# Patient Record
Sex: Male | Born: 1980 | Race: White | Hispanic: No | Marital: Married | Smoking: Current every day smoker
Health system: Southern US, Community
[De-identification: ages and names within clinical notes are randomized; demographics above are authoritative.]

## PROBLEM LIST (undated history)

## (undated) DIAGNOSIS — S82409A Unspecified fracture of shaft of unspecified fibula, initial encounter for closed fracture: Secondary | ICD-10-CM

## (undated) DIAGNOSIS — S82209A Unspecified fracture of shaft of unspecified tibia, initial encounter for closed fracture: Secondary | ICD-10-CM

## (undated) DIAGNOSIS — I10 Essential (primary) hypertension: Secondary | ICD-10-CM

## (undated) DIAGNOSIS — T86822 Skin graft (allograft) (autograft) infection: Secondary | ICD-10-CM

## (undated) DIAGNOSIS — Z22322 Carrier or suspected carrier of Methicillin resistant Staphylococcus aureus: Secondary | ICD-10-CM

## (undated) HISTORY — PX: ARM WOUND REPAIR / CLOSURE: SUR1141

---

## 2011-01-23 ENCOUNTER — Emergency Department (HOSPITAL_BASED_OUTPATIENT_CLINIC_OR_DEPARTMENT_OTHER)
Admission: EM | Admit: 2011-01-23 | Discharge: 2011-01-23 | Disposition: A | Discharge status: Home / Self Care | Payer: Self-pay | Attending: Emergency Medicine | Admitting: Emergency Medicine

## 2011-01-23 DIAGNOSIS — K089 Disorder of teeth and supporting structures, unspecified: Secondary | ICD-10-CM | POA: Insufficient documentation

## 2012-08-06 ENCOUNTER — Encounter (HOSPITAL_BASED_OUTPATIENT_CLINIC_OR_DEPARTMENT_OTHER): Payer: Self-pay | Admitting: Emergency Medicine

## 2012-08-06 ENCOUNTER — Emergency Department (HOSPITAL_BASED_OUTPATIENT_CLINIC_OR_DEPARTMENT_OTHER)
Admission: EM | Admit: 2012-08-06 | Discharge: 2012-08-06 | Disposition: A | Discharge status: Home / Self Care | Payer: Self-pay | Attending: Emergency Medicine | Admitting: Emergency Medicine

## 2012-08-06 DIAGNOSIS — X58XXXA Exposure to other specified factors, initial encounter: Secondary | ICD-10-CM | POA: Insufficient documentation

## 2012-08-06 DIAGNOSIS — Y939 Activity, unspecified: Secondary | ICD-10-CM | POA: Insufficient documentation

## 2012-08-06 DIAGNOSIS — Y929 Unspecified place or not applicable: Secondary | ICD-10-CM | POA: Insufficient documentation

## 2012-08-06 DIAGNOSIS — IMO0002 Reserved for concepts with insufficient information to code with codable children: Secondary | ICD-10-CM | POA: Insufficient documentation

## 2012-08-06 DIAGNOSIS — F17200 Nicotine dependence, unspecified, uncomplicated: Secondary | ICD-10-CM | POA: Insufficient documentation

## 2012-08-06 DIAGNOSIS — S46119A Strain of muscle, fascia and tendon of long head of biceps, unspecified arm, initial encounter: Secondary | ICD-10-CM

## 2012-08-06 MED ORDER — OXYCODONE-ACETAMINOPHEN 5-325 MG PO TABS: 1.0000 | ORAL_TABLET | Freq: Four times a day (QID) | ORAL | Status: DC | PRN

## 2012-08-06 NOTE — ED Notes (Signed)
Pain in right arm x2 days.  Had muscle injury with surgical repair in June. Dr. Christell Constant in [place].  Has been doing well until 2 evenings ago at work when he felt pain "like a bee sting" at injury site.  Pain shooting up and down arm. Pain increased if he extends elbow.

## 2012-08-06 NOTE — ED Provider Notes (Addendum)
History     CSN: [id number]  Arrival date & time 08/06/12  7829   First MD Initiated Contact with Patient 08/06/12 0047      Chief Complaint  Patient presents with  . Arm Pain    (Consider location/radiation/quality/duration/timing/severity/associated sxs/prior treatment) HPI This is a 31 year old male who had his distal biceps transected by a piece of glass in June of this year. The biceps retracted proximally and required surgical repair. He had been doing well until 2 days ago when he felt a sudden onset of pain at the site of the original injury. He describes the pain as feeling like a bee sting. It has persisted and is now shooting up and down his arm. The pain is worse on extension of the elbow. He is still able to move the right elbow. There is no numbness or weakness distally. He does feel some pain in the right side of his neck when he moves his right arm. She's been taking ibuprofen 800 mg without adequate relief. He is out of his oxycodone.  History reviewed. No pertinent past medical history.  Past Surgical History  Procedure Date  . Arm wound repair / closure     No family history on file.  History  Substance Use Topics  . Smoking status: Current Every Day Smoker -- 0.5 packs/day  . Smokeless tobacco: Never Used  . Alcohol Use: Yes     Comment: occ      Review of Systems  All other systems reviewed and are negative.    Allergies  Ceclor and Keflex  Home Medications   Current Outpatient Rx  Name  Route  Sig  Dispense  Refill  . OMEGA-3 FATTY ACIDS 1000 MG PO CAPS   Oral   Take 2 g by mouth daily.         Marland Kitchen ONE-DAILY MULTI VITAMINS PO TABS   Oral   Take 1 tablet by mouth daily.           BP 145/91  Pulse 80  Temp 98.3 F (36.8 C) (Oral)  Resp 16  Ht 5\' 10"  (1.778 m)  Wt 190 lb (86.183 kg)  BMI 27.26 kg/m2  SpO2 99%  Physical Exam General: Well-developed, well-nourished male in no acute distress; appearance consistent with age of  record HENT: normocephalic, atraumatic Eyes: pupils equal round and reactive to light; extraocular muscles intact Neck: supple Heart: regular rate and rhythm Lungs: clear to auscultation bilaterally Abdomen: soft; nondistended Extremities: No deformity; full range of motion; pulses normal; well healed surgical incision over distal right biceps; tenderness over the distal right biceps tendon without evidence of rupture or infection Neurologic: Awake, alert and oriented; motor function intact in all extremities and symmetric; no facial droop Skin: Warm and dry Psychiatric: Normal mood and affect    ED Course  Procedures (including critical care time)     MDM  History and examination consistent with partial tear of the healed distal biceps. We'll immobilize the elbow pending followup with the patient's surgeon, Dr. Christell Constant in [facility]        Carlisle Beers [place], MD 08/06/12 0103  Hanley Seamen, MD 08/06/12 5621

## 2012-09-20 ENCOUNTER — Emergency Department (HOSPITAL_BASED_OUTPATIENT_CLINIC_OR_DEPARTMENT_OTHER)
Admission: EM | Admit: 2012-09-20 | Discharge: 2012-09-20 | Disposition: A | Discharge status: Home / Self Care | Payer: Self-pay | Attending: Emergency Medicine | Admitting: Emergency Medicine

## 2012-09-20 ENCOUNTER — Encounter (HOSPITAL_BASED_OUTPATIENT_CLINIC_OR_DEPARTMENT_OTHER): Payer: Self-pay | Admitting: *Deleted

## 2012-09-20 DIAGNOSIS — M25529 Pain in unspecified elbow: Secondary | ICD-10-CM | POA: Insufficient documentation

## 2012-09-20 DIAGNOSIS — F17200 Nicotine dependence, unspecified, uncomplicated: Secondary | ICD-10-CM | POA: Insufficient documentation

## 2012-09-20 MED ORDER — OXYCODONE-ACETAMINOPHEN 5-325 MG PO TABS: 1.0000 | ORAL_TABLET | Freq: Four times a day (QID) | ORAL | Status: AC | PRN

## 2012-09-20 NOTE — ED Notes (Signed)
Applied a Lrg Ice bag to Pt's Right arm

## 2012-09-20 NOTE — ED Notes (Signed)
States injured right arm in June- had muscle repair- pain in same since Thursday- denies new injury

## 2012-09-20 NOTE — ED Provider Notes (Signed)
History     CSN: [id number]  Arrival date & time 09/20/12  2056   None     Chief Complaint  Patient presents with  . Arm Pain    (Consider location/radiation/quality/duration/timing/severity/associated sxs/prior treatment) HPI  This is a 32 year old male who had his distal biceps transected by a piece of glass in June of this year. The biceps retracted proximally and required surgical repair. He had been doing well until today when he felt a sudden onset of pain at the site of the original injury. He describes the pain as feeling like a bee sting. It has persisted and is now shooting up and down his arm. The pain is worse on extension of the elbow. He is still able to move the right elbow. There is no numbness or weakness distally. He does feel some pain in the right side of his neck when he moves his right arm. She's been taking ibuprofen 800 mg without adequate relief. He is out of his oxycodone. He is supposed to follow-up with Dr. Christell Constant his surgeon but he wants $187 up front and he can not afford that right now. But he has insurance kicking in in 21 days. nad vss   History reviewed. No pertinent past medical history.  Past Surgical History  Procedure Date  . Arm wound repair / closure     No family history on file.  History  Substance Use Topics  . Smoking status: Current Every Day Smoker -- 0.5 packs/day  . Smokeless tobacco: Never Used  . Alcohol Use: 3.6 oz/week    6 Cans of beer per week     Comment: occ      Review of Systems  Review of Systems  Gen: no weight loss, fevers, chills, night sweats  Eyes: no discharge or drainage, no occular pain or visual changes  Nose: no epistaxis or rhinorrhea  Mouth: no dental pain, no sore throat  Neck: no neck pain  Lungs:No wheezing, coughing or hemoptysis CV: no chest pain, palpitations, dependent edema or orthopnea  Abd: no abdominal pain, nausea, vomiting  GU: no dysuria or gross hematuria  MSK:  Right elbow  pain Neuro: no headache, no focal neurologic deficits  Skin: no abnormalities Psyche: negative.   Allergies  Ceclor and Keflex  Home Medications   Current Outpatient Rx  Name  Route  Sig  Dispense  Refill  . AMOXICILLIN 500 MG PO TABS   Oral   Take 500 mg by mouth 3 (three) times daily.         . OMEGA-3 FATTY ACIDS 1000 MG PO CAPS   Oral   Take 2 g by mouth daily.         Marland Kitchen ONE-DAILY MULTI VITAMINS PO TABS   Oral   Take 1 tablet by mouth daily.         . OXYCODONE-ACETAMINOPHEN 5-325 MG PO TABS   Oral   Take 1-2 tablets by mouth every 6 (six) hours as needed for pain.   30 tablet   0     BP 155/83  Pulse 98  Temp 98.3 F (36.8 C) (Oral)  Resp 18  SpO2 99%  Physical Exam  Nursing note and vitals reviewed. Constitutional: He appears well-developed and well-nourished. No distress.  HENT:  Head: Normocephalic and atraumatic.  Eyes: Pupils are equal, round, and reactive to light.  Neck: Normal range of motion. Neck supple.  Cardiovascular: Normal rate and regular rhythm.   Pulmonary/Chest: Effort normal.  Abdominal: Soft.  Musculoskeletal:       Right elbow: He exhibits decreased range of motion. He exhibits no swelling, no effusion, no deformity and no laceration.  Neurological: He is alert.  Skin: Skin is warm and dry.    ED Course  Procedures (including critical care time)  Labs Reviewed - No data to display No results found.   1. Elbow pain       MDM  Pt will see his surgeon after his insurance kicks in.  He has no new symptoms at this time and is out of his pain medications. Dr. Christell Constant thinks that they may need to open him back up and work on his arm some.  Pain med refill given  Pt has been advised of the symptoms that warrant their return to the ED. Patient has voiced understanding and has agreed to follow-up with the PCP or specialist.         Dorthula Matas, PA 09/20/12 2300

## 2012-09-20 NOTE — ED Notes (Signed)
Patient states that his right arm has been hurting for the past few days. States that he was prescribed percocet but that he has run out and that he wont have medical insurance until next month.

## 2012-09-21 NOTE — ED Provider Notes (Signed)
Medical screening examination/treatment/procedure(s) were performed by non-physician practitioner and as supervising physician I was immediately available for consultation/collaboration.   Shelda Jakes, MD 09/21/12 1225

## 2012-10-16 ENCOUNTER — Encounter (HOSPITAL_BASED_OUTPATIENT_CLINIC_OR_DEPARTMENT_OTHER): Payer: Self-pay | Admitting: *Deleted

## 2012-10-16 ENCOUNTER — Emergency Department (HOSPITAL_BASED_OUTPATIENT_CLINIC_OR_DEPARTMENT_OTHER)
Admission: EM | Admit: 2012-10-16 | Discharge: 2012-10-16 | Disposition: A | Discharge status: Home / Self Care | Payer: Self-pay | Attending: Emergency Medicine | Admitting: Emergency Medicine

## 2012-10-16 DIAGNOSIS — M79603 Pain in arm, unspecified: Secondary | ICD-10-CM

## 2012-10-16 DIAGNOSIS — Z79899 Other long term (current) drug therapy: Secondary | ICD-10-CM | POA: Insufficient documentation

## 2012-10-16 DIAGNOSIS — M79609 Pain in unspecified limb: Secondary | ICD-10-CM | POA: Insufficient documentation

## 2012-10-16 DIAGNOSIS — F17200 Nicotine dependence, unspecified, uncomplicated: Secondary | ICD-10-CM | POA: Insufficient documentation

## 2012-10-16 DIAGNOSIS — G8911 Acute pain due to trauma: Secondary | ICD-10-CM | POA: Insufficient documentation

## 2012-10-16 MED ORDER — OXYCODONE-ACETAMINOPHEN 5-325 MG PO TABS: 1.0000 | ORAL_TABLET | ORAL | Status: AC | PRN

## 2012-10-16 NOTE — ED Notes (Signed)
Right arm pain. States he had surgery on his right arm in June and has been having pain at the surgical site today.

## 2012-10-16 NOTE — ED Provider Notes (Signed)
History     CSN: [id number]  Arrival date & time 10/16/12  2056   First MD Initiated Contact with Patient 10/16/12 2111      Chief Complaint  Patient presents with  . Arm Pain    (Consider location/radiation/quality/duration/timing/severity/associated sxs/prior treatment) HPI Comments: This is a 32 year old male who had his distal biceps transected by a piece of glass in June of this year. The biceps retracted proximally and required surgical repair. He had been doing well until the last 2 months when he feels intermittent  sudden onset of pain at the site of the original injury. He describes the pain as feeling like a bee sting. It has persisted and is now shooting up and down his arm. The pain is worse on extension of the elbow. He is still able to move the right elbow. There is no numbness or weakness distally. He does feel some pain in the right side of his neck when he moves his right arm. He is out of his oxycodone. He is supposed to follow-up with Dr. Christell Constant his surgeon but he wants $175 up front and he can not afford that right now. But he has insurance kicking in in 21 days.    Patient is a 32 y.o. male presenting with arm pain.  Arm Pain    History reviewed. No pertinent past medical history.  Past Surgical History  Procedure Date  . Arm wound repair / closure     No family history on file.  History  Substance Use Topics  . Smoking status: Current Every Day Smoker -- 0.5 packs/day    Types: Cigarettes  . Smokeless tobacco: Never Used  . Alcohol Use: 3.6 oz/week    6 Cans of beer per week     Comment: occ      Review of Systems  Constitutional: Negative.   Respiratory: Negative.   Cardiovascular: Negative.     Allergies  Ceclor and Keflex  Home Medications   Current Outpatient Rx  Name  Route  Sig  Dispense  Refill  . AMOXICILLIN 500 MG PO TABS   Oral   Take 500 mg by mouth 3 (three) times daily.         . OMEGA-3 FATTY ACIDS 1000 MG PO CAPS  Oral   Take 2 g by mouth daily.         Marland Kitchen ONE-DAILY MULTI VITAMINS PO TABS   Oral   Take 1 tablet by mouth daily.         . OXYCODONE-ACETAMINOPHEN 5-325 MG PO TABS   Oral   Take 1-2 tablets by mouth every 6 (six) hours as needed for pain.   30 tablet   0     BP 152/79  Pulse 104  Temp 97.8 F (36.6 C) (Oral)  Resp 20  SpO2 100%  Physical Exam  Nursing note and vitals reviewed. Constitutional: He is oriented to person, place, and time. He appears well-developed and well-nourished.  HENT:  Head: Normocephalic and atraumatic.  Cardiovascular: Normal rate and regular rhythm.   Pulmonary/Chest: Effort normal and breath sounds normal.  Musculoskeletal:       Pt has pain on palpation over the healed surgical site to the right ac:pt has full WUJ:WJXB equal  Neurological: He is alert and oriented to person, place, and time.  Skin: Skin is warm and dry.  Psychiatric: He has a normal mood and affect.    ED Course  Procedures (including critical care time)  Labs Reviewed -  No data to display No results found.   1. Arm pain       MDM  Will treat with oxycodone:discussed with pt that this is a chronic problem and that he would likely not continue to get narcotics thru the ed       Teressa Lower, NP 10/16/12 2137

## 2012-10-17 NOTE — ED Provider Notes (Signed)
Medical screening examination/treatment/procedure(s) were performed by non-physician practitioner and as supervising physician I was immediately available for consultation/collaboration.  Kaidon Kinker, MD 10/17/12 0042 

## 2013-03-14 ENCOUNTER — Emergency Department (HOSPITAL_BASED_OUTPATIENT_CLINIC_OR_DEPARTMENT_OTHER)
Admission: EM | Admit: 2013-03-14 | Discharge: 2013-03-14 | Disposition: A | Discharge status: Home / Self Care | Payer: Self-pay | Attending: Emergency Medicine | Admitting: Emergency Medicine

## 2013-03-14 ENCOUNTER — Encounter (HOSPITAL_BASED_OUTPATIENT_CLINIC_OR_DEPARTMENT_OTHER): Payer: Self-pay | Admitting: *Deleted

## 2013-03-14 DIAGNOSIS — G8929 Other chronic pain: Secondary | ICD-10-CM | POA: Insufficient documentation

## 2013-03-14 DIAGNOSIS — Z792 Long term (current) use of antibiotics: Secondary | ICD-10-CM | POA: Insufficient documentation

## 2013-03-14 DIAGNOSIS — M79603 Pain in arm, unspecified: Secondary | ICD-10-CM

## 2013-03-14 DIAGNOSIS — M79609 Pain in unspecified limb: Secondary | ICD-10-CM | POA: Insufficient documentation

## 2013-03-14 DIAGNOSIS — Z87828 Personal history of other (healed) physical injury and trauma: Secondary | ICD-10-CM | POA: Insufficient documentation

## 2013-03-14 DIAGNOSIS — F17200 Nicotine dependence, unspecified, uncomplicated: Secondary | ICD-10-CM | POA: Insufficient documentation

## 2013-03-14 HISTORY — DX: Essential (primary) hypertension: I10

## 2013-03-14 MED ORDER — HYDROCODONE-ACETAMINOPHEN 5-325 MG PO TABS: 1.0000 | ORAL_TABLET | Freq: Four times a day (QID) | ORAL | Status: AC | PRN

## 2013-03-14 MED ORDER — TRAMADOL HCL 50 MG PO TABS
50.0000 mg | ORAL_TABLET | Freq: Once | ORAL | Status: AC
  Administered 2013-03-14: 50 mg via ORAL
  Filled 2013-03-14: qty 1

## 2013-03-14 NOTE — ED Notes (Signed)
Pt ambulating independently w/ steady gait on d/c in no acute distress, A&Ox4. D/c instructions reviewed w/ pt and family - pt and family deny any further questions or concerns at present. Rx given x1, Pt instructed to not use alcohol, drive, or operate heavy machinery while take the prescription pain medications as they could make him drowsy - pt verbalized understanding.    
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
## 2013-03-14 NOTE — ED Provider Notes (Signed)
History    CSN: [id number] Arrival date & time 03/14/13  0202  First MD Initiated Contact with Patient 03/14/13 0210     Chief Complaint  Patient presents with  . Arm Pain   (Consider location/radiation/quality/duration/timing/severity/associated sxs/prior Treatment) Patient is a 32 y.o. male presenting with arm injury. The history is provided by the patient.  Arm Injury Upper extremity pain location: biceps tendon. Time since incident:  12 months Injury: yes   Pain details:    Quality:  Aching   Radiates to:  Does not radiate   Severity:  Severe   Onset quality:  Gradual   Duration:  12 months   Timing:  Constant   Progression:  Unchanged Chronicity:  Chronic Dislocation: no   Foreign body present:  No foreign bodies Relieved by:  Nothing Worsened by:  Nothing tried Ineffective treatments:  None tried Associated symptoms: no neck pain, no numbness, no stiffness, no swelling and no tingling    No past medical history on file. Past Surgical History  Procedure Laterality Date  . Arm wound repair / closure     No family history on file. History  Substance Use Topics  . Smoking status: Current Every Day Smoker -- 0.50 packs/day    Types: Cigarettes  . Smokeless tobacco: Never Used  . Alcohol Use: 3.6 oz/week    6 Cans of beer per week     Comment: occ    Review of Systems  HENT: Negative for neck pain.   Musculoskeletal: Negative for stiffness.  All other systems reviewed and are negative.    Allergies  Ceclor and Keflex  Home Medications   Current Outpatient Rx  Name  Route  Sig  Dispense  Refill  . amoxicillin (AMOXIL) 500 MG tablet   Oral   Take 500 mg by mouth 3 (three) times daily.         . fish oil-omega-3 fatty acids 1000 MG capsule   Oral   Take 2 g by mouth daily.         . Multiple Vitamin (MULTIVITAMIN) tablet   Oral   Take 1 tablet by mouth daily.         Marland Kitchen oxyCODONE-acetaminophen (PERCOCET/ROXICET) 5-325 MG per tablet  Oral   Take 1-2 tablets by mouth every 6 (six) hours as needed for pain.   30 tablet   0   . oxyCODONE-acetaminophen (PERCOCET/ROXICET) 5-325 MG per tablet   Oral   Take 1 tablet by mouth every 4 (four) hours as needed for pain.   6 tablet   0    There were no vitals taken for this visit. Physical Exam  Constitutional: He is oriented to person, place, and time. He appears well-developed and well-nourished. No distress.  HENT:  Head: Normocephalic and atraumatic.  Eyes: Conjunctivae and EOM are normal. Pupils are equal, round, and reactive to light.  Neck: Normal range of motion. Neck supple.  Cardiovascular: Normal rate, regular rhythm and intact distal pulses.   Pulmonary/Chest: Effort normal and breath sounds normal. He has no wheezes. He has no rales.  Abdominal: Soft. Bowel sounds are normal. There is no tenderness. There is no rebound and no guarding.  Musculoskeletal: Normal range of motion. He exhibits no edema.  Intact biceps tendon  Neurological: He is alert and oriented to person, place, and time. He has normal reflexes.  Skin: Skin is warm and dry.  Psychiatric: He has a normal mood and affect.    ED Course  Procedures (including  critical care time) Labs Reviewed - No data to display No results found. No diagnosis found.  MDM  Chronic pain.  Follow up with your regular provider.    Jasmine Awe, MD 03/14/13 [phone]

## 2013-03-23 ENCOUNTER — Encounter (HOSPITAL_BASED_OUTPATIENT_CLINIC_OR_DEPARTMENT_OTHER): Payer: Self-pay | Admitting: Emergency Medicine

## 2013-03-23 ENCOUNTER — Emergency Department (HOSPITAL_BASED_OUTPATIENT_CLINIC_OR_DEPARTMENT_OTHER)
Admission: EM | Admit: 2013-03-23 | Discharge: 2013-03-23 | Disposition: A | Discharge status: Home / Self Care | Payer: Self-pay | Attending: Emergency Medicine | Admitting: Emergency Medicine

## 2013-03-23 DIAGNOSIS — Z87828 Personal history of other (healed) physical injury and trauma: Secondary | ICD-10-CM | POA: Insufficient documentation

## 2013-03-23 DIAGNOSIS — M79609 Pain in unspecified limb: Secondary | ICD-10-CM | POA: Insufficient documentation

## 2013-03-23 DIAGNOSIS — G8929 Other chronic pain: Secondary | ICD-10-CM | POA: Insufficient documentation

## 2013-03-23 DIAGNOSIS — I10 Essential (primary) hypertension: Secondary | ICD-10-CM | POA: Insufficient documentation

## 2013-03-23 DIAGNOSIS — Z79899 Other long term (current) drug therapy: Secondary | ICD-10-CM | POA: Insufficient documentation

## 2013-03-23 DIAGNOSIS — Z792 Long term (current) use of antibiotics: Secondary | ICD-10-CM | POA: Insufficient documentation

## 2013-03-23 DIAGNOSIS — F17200 Nicotine dependence, unspecified, uncomplicated: Secondary | ICD-10-CM | POA: Insufficient documentation

## 2013-03-23 MED ORDER — OXYCODONE-ACETAMINOPHEN 5-325 MG PO TABS: 2.0000 | ORAL_TABLET | Freq: Four times a day (QID) | ORAL | Status: AC | PRN

## 2013-03-23 MED ORDER — KETOROLAC TROMETHAMINE 60 MG/2ML IM SOLN
60.0000 mg | Freq: Once | INTRAMUSCULAR | Status: AC
  Administered 2013-03-23: 60 mg via INTRAMUSCULAR
  Filled 2013-03-23: qty 2

## 2013-03-23 NOTE — ED Provider Notes (Signed)
History    CSN: [id number] Arrival date & time 03/23/13  0350  First MD Initiated Contact with Patient 03/23/13 0410     Chief Complaint  Patient presents with  . Arm Injury   (Consider location/radiation/quality/duration/timing/severity/associated sxs/prior Treatment) Patient is a 32 y.o. male presenting with arm injury. The history is provided by the patient.  Arm Injury Location:  Elbow Time since incident:  2 weeks Injury: yes   Mechanism of injury comment:  Initial injury was deep laceration from blade at work requiring surgical repair due to laceration of the bicep tendon.  last week he moved somehting while at work and felt severe pain in the arm again and not imrpoved Elbow location:  R elbow Pain details:    Quality:  Aching, sharp, throbbing and tearing   Radiates to:  R shoulder and R wrist   Severity:  Severe   Onset quality:  Sudden   Duration:  2 weeks   Timing:  Constant   Progression:  Unchanged Chronicity:  Recurrent Handedness:  Right-handed Prior injury to area:  Yes Relieved by:  Nothing Worsened by:  Movement Ineffective treatments:  Immobilization, muscle relaxant, narcotics and NSAIDs Associated symptoms: decreased range of motion, muscle weakness and tingling   Associated symptoms: no swelling    Past Medical History  Diagnosis Date  . Hypertension    Past Surgical History  Procedure Laterality Date  . Arm wound repair / closure     No family history on file. History  Substance Use Topics  . Smoking status: Current Every Day Smoker -- 0.50 packs/day    Types: Cigarettes  . Smokeless tobacco: Never Used  . Alcohol Use: 3.6 oz/week    6 Cans of beer per week     Comment: occ    Review of Systems  All other systems reviewed and are negative.    Allergies  Ceclor and Keflex  Home Medications   Current Outpatient Rx  Name  Route  Sig  Dispense  Refill  . amoxicillin (AMOXIL) 500 MG tablet   Oral   Take 500 mg by mouth 3 (three)  times daily.         . fish oil-omega-3 fatty acids 1000 MG capsule   Oral   Take 2 g by mouth daily.         Marland Kitchen HYDROcodone-acetaminophen (NORCO) 5-325 MG per tablet   Oral   Take 1 tablet by mouth every 6 (six) hours as needed for pain.   6 tablet   0   . Multiple Vitamin (MULTIVITAMIN) tablet   Oral   Take 1 tablet by mouth daily.         Marland Kitchen oxyCODONE-acetaminophen (PERCOCET/ROXICET) 5-325 MG per tablet   Oral   Take 1-2 tablets by mouth every 6 (six) hours as needed for pain.   30 tablet   0   . oxyCODONE-acetaminophen (PERCOCET/ROXICET) 5-325 MG per tablet   Oral   Take 1 tablet by mouth every 4 (four) hours as needed for pain.   6 tablet   0   . oxyCODONE-acetaminophen (PERCOCET/ROXICET) 5-325 MG per tablet   Oral   Take 2 tablets by mouth every 6 (six) hours as needed for pain.   15 tablet   0    BP 136/80  Pulse 98  Temp(Src) 98 F (36.7 C) (Oral)  Resp 18  SpO2 98% Physical Exam  Nursing note and vitals reviewed. Constitutional: He is oriented to person, place, and time. He appears  well-developed and well-nourished. He appears distressed.  HENT:  Head: Normocephalic and atraumatic.  Eyes: EOM are normal. Pupils are equal, round, and reactive to light.  Cardiovascular: Normal rate.   Pulmonary/Chest: Effort normal.  Musculoskeletal:       Right elbow: He exhibits decreased range of motion. Tenderness found.       Arms: Neurological: He is alert and oriented to person, place, and time.  Weakness to the right bicep tendon with difficulty flexing against gravity  Skin: Skin is warm and dry.  Psychiatric: He has a normal mood and affect. His behavior is normal.    ED Course  Procedures (including critical care time) Labs Reviewed - No data to display No results found. 1. Chronic arm pain     MDM   Patient with a laceration of his arm approximately 8 months ago while at work lacerating his bicep tendon. He states things were going well until  approximately one week ago when he moved something at work and felt a pop in his arm. Since that time he's had severe pain but because he does the orthopedist $900 he will not see him. He states his pain the next week and he will see him and felt that he may need ongoing repair the arm. He has muscle weakness to the bicep tendon but 2+ radial pulse normal sensation and capillary refill. Patient seen here 10 days ago for same and states he is out of medication. He's been taking Flexeril as well but not helping. Patient given IM Toradol and refill prescription.  Gwyneth Sprout, MD 03/23/13 [phone]

## 2013-03-23 NOTE — ED Notes (Signed)
Pt c/o right arm pain which is chronic. Pt has been seen here on several occassions for same.

## 2013-03-23 NOTE — ED Notes (Signed)
Pt states he is out of percocet but has a few vicodin left, but vicodin doesn't help his pain.

## 2013-06-24 ENCOUNTER — Encounter (HOSPITAL_BASED_OUTPATIENT_CLINIC_OR_DEPARTMENT_OTHER): Payer: Self-pay | Admitting: Emergency Medicine

## 2013-06-24 ENCOUNTER — Emergency Department (HOSPITAL_BASED_OUTPATIENT_CLINIC_OR_DEPARTMENT_OTHER): Payer: Self-pay

## 2013-06-24 ENCOUNTER — Emergency Department (HOSPITAL_BASED_OUTPATIENT_CLINIC_OR_DEPARTMENT_OTHER)
Admission: EM | Admit: 2013-06-24 | Discharge: 2013-06-24 | Discharge status: Against Medical Advice | Payer: Self-pay | Attending: Emergency Medicine | Admitting: Emergency Medicine

## 2013-06-24 DIAGNOSIS — T86822 Skin graft (allograft) (autograft) infection: Secondary | ICD-10-CM

## 2013-06-24 DIAGNOSIS — M79605 Pain in left leg: Secondary | ICD-10-CM

## 2013-06-24 DIAGNOSIS — Z79899 Other long term (current) drug therapy: Secondary | ICD-10-CM | POA: Insufficient documentation

## 2013-06-24 DIAGNOSIS — T8579XA Infection and inflammatory reaction due to other internal prosthetic devices, implants and grafts, initial encounter: Secondary | ICD-10-CM | POA: Insufficient documentation

## 2013-06-24 DIAGNOSIS — F17200 Nicotine dependence, unspecified, uncomplicated: Secondary | ICD-10-CM | POA: Insufficient documentation

## 2013-06-24 DIAGNOSIS — Z792 Long term (current) use of antibiotics: Secondary | ICD-10-CM | POA: Insufficient documentation

## 2013-06-24 DIAGNOSIS — Y838 Other surgical procedures as the cause of abnormal reaction of the patient, or of later complication, without mention of misadventure at the time of the procedure: Secondary | ICD-10-CM | POA: Insufficient documentation

## 2013-06-24 DIAGNOSIS — I10 Essential (primary) hypertension: Secondary | ICD-10-CM | POA: Insufficient documentation

## 2013-06-24 HISTORY — DX: Skin graft (allograft) (autograft) infection: T86.822

## 2013-06-24 LAB — CBC WITH DIFFERENTIAL/PLATELET
Eosinophils Absolute: 0.2 10*3/uL (ref 0.0–0.7)
Eosinophils Relative: 2 % (ref 0–5)
HCT: 38.1 % — ABNORMAL LOW (ref 39.0–52.0)
Lymphocytes Relative: 25 % (ref 12–46)
Lymphs Abs: 2.1 10*3/uL (ref 0.7–4.0)
MCH: 28.2 pg (ref 26.0–34.0)
MCV: 86 fL (ref 78.0–100.0)
Monocytes Absolute: 0.6 10*3/uL (ref 0.1–1.0)
Platelets: 240 10*3/uL (ref 150–400)
RBC: 4.43 MIL/uL (ref 4.22–5.81)
RDW: 14.2 % (ref 11.5–15.5)
WBC: 8.2 10*3/uL (ref 4.0–10.5)

## 2013-06-24 LAB — BASIC METABOLIC PANEL
BUN: 11 mg/dL (ref 6–23)
CO2: 26 mEq/L (ref 19–32)
Calcium: 9.7 mg/dL (ref 8.4–10.5)
Creatinine, Ser: 0.9 mg/dL (ref 0.50–1.35)
GFR calc non Af Amer: >90 mL/min (ref >90)
Glucose, Bld: 114 mg/dL — ABNORMAL HIGH (ref 70–99)
Sodium: 140 mEq/L (ref 135–145)

## 2013-06-24 MED ORDER — KETOROLAC TROMETHAMINE 60 MG/2ML IM SOLN
60.0000 mg | Freq: Once | INTRAMUSCULAR | Status: AC
  Administered 2013-06-24: 60 mg via INTRAMUSCULAR
  Filled 2013-06-24: qty 2
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
## 2013-08-25 ENCOUNTER — Emergency Department (HOSPITAL_BASED_OUTPATIENT_CLINIC_OR_DEPARTMENT_OTHER)
Admission: EM | Admit: 2013-08-25 | Discharge: 2013-08-25 | Disposition: A | Discharge status: Home / Self Care | Payer: Self-pay | Attending: Emergency Medicine | Admitting: Emergency Medicine

## 2013-08-25 ENCOUNTER — Emergency Department (HOSPITAL_BASED_OUTPATIENT_CLINIC_OR_DEPARTMENT_OTHER): Payer: Self-pay

## 2013-08-25 ENCOUNTER — Encounter (HOSPITAL_BASED_OUTPATIENT_CLINIC_OR_DEPARTMENT_OTHER): Payer: Self-pay | Admitting: Emergency Medicine

## 2013-08-25 DIAGNOSIS — F17200 Nicotine dependence, unspecified, uncomplicated: Secondary | ICD-10-CM | POA: Insufficient documentation

## 2013-08-25 DIAGNOSIS — Z8781 Personal history of (healed) traumatic fracture: Secondary | ICD-10-CM | POA: Insufficient documentation

## 2013-08-25 DIAGNOSIS — Z79899 Other long term (current) drug therapy: Secondary | ICD-10-CM | POA: Insufficient documentation

## 2013-08-25 DIAGNOSIS — M2550 Pain in unspecified joint: Secondary | ICD-10-CM | POA: Insufficient documentation

## 2013-08-25 DIAGNOSIS — Z8619 Personal history of other infectious and parasitic diseases: Secondary | ICD-10-CM | POA: Insufficient documentation

## 2013-08-25 DIAGNOSIS — M79605 Pain in left leg: Secondary | ICD-10-CM

## 2013-08-25 DIAGNOSIS — M2540 Effusion, unspecified joint: Secondary | ICD-10-CM | POA: Insufficient documentation

## 2013-08-25 DIAGNOSIS — I10 Essential (primary) hypertension: Secondary | ICD-10-CM | POA: Insufficient documentation

## 2013-08-25 DIAGNOSIS — Z9889 Other specified postprocedural states: Secondary | ICD-10-CM | POA: Insufficient documentation

## 2013-08-25 DIAGNOSIS — Z792 Long term (current) use of antibiotics: Secondary | ICD-10-CM | POA: Insufficient documentation

## 2013-08-25 DIAGNOSIS — L02419 Cutaneous abscess of limb, unspecified: Secondary | ICD-10-CM | POA: Insufficient documentation

## 2013-08-25 DIAGNOSIS — L0390 Cellulitis, unspecified: Secondary | ICD-10-CM

## 2013-08-25 MED ORDER — KETOROLAC TROMETHAMINE 60 MG/2ML IM SOLN
60.0000 mg | Freq: Once | INTRAMUSCULAR | Status: AC
  Administered 2013-08-25: 60 mg via INTRAMUSCULAR
  Filled 2013-08-25: qty 2

## 2013-08-25 MED ORDER — OXYCODONE-ACETAMINOPHEN 5-325 MG PO TABS: 2.0000 | ORAL_TABLET | ORAL | Status: AC | PRN

## 2013-08-25 MED ORDER — CLINDAMYCIN HCL 300 MG PO CAPS: 300.0000 mg | ORAL_CAPSULE | Freq: Four times a day (QID) | ORAL | Status: AC

## 2013-08-25 MED ORDER — CLINDAMYCIN HCL 150 MG PO CAPS
ORAL_CAPSULE | ORAL | Status: AC
  Administered 2013-08-25: 300 mg via ORAL
  Filled 2013-08-25: qty 2

## 2013-08-25 MED ORDER — CLINDAMYCIN HCL 150 MG PO CAPS
300.0000 mg | ORAL_CAPSULE | Freq: Once | ORAL | Status: AC
  Administered 2013-08-25: 300 mg via ORAL

## 2013-08-25 NOTE — ED Notes (Signed)
Patient transported to X-ray 
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
## 2013-09-01 ENCOUNTER — Emergency Department (HOSPITAL_BASED_OUTPATIENT_CLINIC_OR_DEPARTMENT_OTHER)
Admission: EM | Admit: 2013-09-01 | Discharge: 2013-09-01 | Disposition: A | Discharge status: Home / Self Care | Payer: No Typology Code available for payment source | Attending: Emergency Medicine | Admitting: Emergency Medicine

## 2013-09-01 ENCOUNTER — Encounter (HOSPITAL_BASED_OUTPATIENT_CLINIC_OR_DEPARTMENT_OTHER): Payer: Self-pay | Admitting: Emergency Medicine

## 2013-09-01 DIAGNOSIS — G8929 Other chronic pain: Secondary | ICD-10-CM | POA: Insufficient documentation

## 2013-09-01 DIAGNOSIS — Z792 Long term (current) use of antibiotics: Secondary | ICD-10-CM | POA: Insufficient documentation

## 2013-09-01 DIAGNOSIS — Z79899 Other long term (current) drug therapy: Secondary | ICD-10-CM | POA: Insufficient documentation

## 2013-09-01 DIAGNOSIS — F17200 Nicotine dependence, unspecified, uncomplicated: Secondary | ICD-10-CM | POA: Insufficient documentation

## 2013-09-01 DIAGNOSIS — Z8781 Personal history of (healed) traumatic fracture: Secondary | ICD-10-CM | POA: Insufficient documentation

## 2013-09-01 DIAGNOSIS — Z8614 Personal history of Methicillin resistant Staphylococcus aureus infection: Secondary | ICD-10-CM | POA: Insufficient documentation

## 2013-09-01 DIAGNOSIS — Z87828 Personal history of other (healed) physical injury and trauma: Secondary | ICD-10-CM | POA: Insufficient documentation

## 2013-09-01 DIAGNOSIS — M79609 Pain in unspecified limb: Secondary | ICD-10-CM | POA: Insufficient documentation

## 2013-09-01 DIAGNOSIS — I10 Essential (primary) hypertension: Secondary | ICD-10-CM | POA: Insufficient documentation

## 2013-09-01 HISTORY — DX: Unspecified fracture of shaft of unspecified fibula, initial encounter for closed fracture: S82.409A

## 2013-09-01 HISTORY — DX: Carrier or suspected carrier of methicillin resistant Staphylococcus aureus: Z22.322

## 2013-09-01 HISTORY — DX: Unspecified fracture of shaft of unspecified tibia, initial encounter for closed fracture: S82.209A

## 2013-09-01 MED ORDER — ETODOLAC 500 MG PO TABS: 500.0000 mg | ORAL_TABLET | Freq: Two times a day (BID) | ORAL | Status: AC

## 2013-09-01 MED ORDER — KETOROLAC TROMETHAMINE 60 MG/2ML IM SOLN
60.0000 mg | Freq: Once | INTRAMUSCULAR | Status: AC
  Administered 2013-09-01: 60 mg via INTRAMUSCULAR
  Filled 2013-09-01: qty 2
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
## 2013-09-01 NOTE — ED Notes (Signed)
Pt is here for pain management, his insurance will not start to the first of the year, and he can not seen the pain management clinic until his insurance starts.  Wound infection appears to be improved.
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
## 2013-09-20 ENCOUNTER — Encounter (HOSPITAL_BASED_OUTPATIENT_CLINIC_OR_DEPARTMENT_OTHER): Payer: Self-pay | Admitting: Emergency Medicine

## 2013-09-20 ENCOUNTER — Emergency Department (HOSPITAL_BASED_OUTPATIENT_CLINIC_OR_DEPARTMENT_OTHER)
Admission: EM | Admit: 2013-09-20 | Discharge: 2013-09-20 | Disposition: A | Discharge status: Home / Self Care | Payer: No Typology Code available for payment source | Attending: Emergency Medicine | Admitting: Emergency Medicine

## 2013-09-20 DIAGNOSIS — Z8614 Personal history of Methicillin resistant Staphylococcus aureus infection: Secondary | ICD-10-CM | POA: Insufficient documentation

## 2013-09-20 DIAGNOSIS — I10 Essential (primary) hypertension: Secondary | ICD-10-CM | POA: Insufficient documentation

## 2013-09-20 DIAGNOSIS — Z8781 Personal history of (healed) traumatic fracture: Secondary | ICD-10-CM | POA: Insufficient documentation

## 2013-09-20 DIAGNOSIS — M79609 Pain in unspecified limb: Secondary | ICD-10-CM | POA: Insufficient documentation

## 2013-09-20 DIAGNOSIS — G8929 Other chronic pain: Secondary | ICD-10-CM | POA: Insufficient documentation

## 2013-09-20 DIAGNOSIS — Z79899 Other long term (current) drug therapy: Secondary | ICD-10-CM | POA: Insufficient documentation

## 2013-09-20 DIAGNOSIS — F17200 Nicotine dependence, unspecified, uncomplicated: Secondary | ICD-10-CM | POA: Insufficient documentation

## 2013-09-20 DIAGNOSIS — Z791 Long term (current) use of non-steroidal anti-inflammatories (NSAID): Secondary | ICD-10-CM | POA: Insufficient documentation

## 2013-09-20 DIAGNOSIS — Z792 Long term (current) use of antibiotics: Secondary | ICD-10-CM | POA: Insufficient documentation

## 2013-09-20 MED ORDER — OXYCODONE-ACETAMINOPHEN 5-325 MG PO TABS: 1.0000 | ORAL_TABLET | Freq: Four times a day (QID) | ORAL | Status: AC | PRN

## 2013-09-20 MED ORDER — OXYCODONE-ACETAMINOPHEN 5-325 MG PO TABS
2.0000 | ORAL_TABLET | Freq: Once | ORAL | Status: AC
  Administered 2013-09-20: 2 via ORAL
  Filled 2013-09-20: qty 2
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
## 2013-09-20 NOTE — ED Notes (Signed)
Left leg pain. Hx of chronic pain.

## 2013-09-20 NOTE — Discharge Instructions (Signed)
Chronic Pain °Chronic pain can be defined as pain that is off and on and lasts for 3 6 months or longer. Many things cause chronic pain, which can make it difficult to make a diagnosis. There are many treatment options available for chronic pain. However, finding a treatment that works well for you may require trying various approaches until the right one is found. Many people benefit from a combination of two or more types of treatment to control their pain. °SYMPTOMS  °Chronic pain can occur anywhere in the body and can range from mild to very severe. Some types of chronic pain include: °· Headache. °· Low back pain. °· Cancer pain. °· Arthritis pain. °· Neurogenic pain. This is pain resulting from damage to nerves. ° People with chronic pain may also have other symptoms such as: °· Depression. °· Anger. °· Insomnia. °· Anxiety. °DIAGNOSIS  °Your health care provider will help diagnose your condition over time. In many cases, the initial focus will be on excluding possible conditions that could be causing the pain. Depending on your symptoms, your health care provider may order tests to diagnose your condition. Some of these tests may include:  °· Blood tests.   °· CT scan.   °· MRI.   °· X-rays.   °· Ultrasounds.   °· Nerve conduction studies.   °You may need to see a specialist.  °TREATMENT  °Finding treatment that works well may take time. You may be referred to a pain specialist. He or she may prescribe medicine or therapies, such as:  °· Mindful meditation or yoga. °· Shots (injections) of numbing or pain-relieving medicines into the spine or area of pain. °· Local electrical stimulation. °· Acupuncture.   °· Massage therapy.   °· Aroma, color, light, or sound therapy.   °· Biofeedback.   °· Working with a physical therapist to keep from getting stiff.   °· Regular, gentle exercise.   °· Cognitive or behavioral therapy.   °· Group support.   °Sometimes, surgery may be recommended.  °HOME CARE INSTRUCTIONS   °· Take all medicines as directed by your health care provider.   °· Lessen stress in your life by relaxing and doing things such as listening to calming music.   °· Exercise or be active as directed by your health care provider.   °· Eat a healthy diet and include things such as vegetables, fruits, fish, and lean meats in your diet.   °· Keep all follow-up appointments with your health care provider.   °· Attend a support group with others suffering from chronic pain. °SEEK MEDICAL CARE IF:  °· Your pain gets worse.   °· You develop a new pain that was not there before.   °· You cannot tolerate medicines given to you by your health care provider.   °· You have new symptoms since your last visit with your health care provider.   °SEEK IMMEDIATE MEDICAL CARE IF:  °· You feel weak.   °· You have decreased sensation or numbness.   °· You lose control of bowel or bladder function.   °· Your pain suddenly gets much worse.   °· You develop shaking. °· You develop chills. °· You develop confusion. °· You develop chest pain. °· You develop shortness of breath.   °MAKE SURE YOU: °· Understand these instructions. °· Will watch your condition. °· Will get help right away if you are not doing well or get worse. °Document Released: 05/25/2002 Document Revised: 05/05/2013 Document Reviewed: 02/26/2013 °ExitCare® Patient Information ©2014 [employer]. ° ° ° °Emergency Department Resource Guide °1) Find a Doctor and Pay Out of Pocket °Although you won't have   to find out who is covered by your insurance plan, it is a good idea to ask around and get recommendations. You will then need to call the office and see if the doctor you have chosen will accept you as a new patient and what types of options they offer for patients who are self-pay. Some doctors offer discounts or will set up payment plans for their patients who do not have insurance, but you will need to ask so you aren't surprised when you get to your appointment. ° °2)  Contact Your Local Health Department °Not all health departments have doctors that can see patients for sick visits, but many do, so it is worth a call to see if yours does. If you don't know where your local health department is, you can check in your phone book. The CDC also has a tool to help you locate your state's health department, and many state websites also have listings of all of their local health departments. ° °3) Find a Walk-in Clinic °If your illness is not likely to be very severe or complicated, you may want to try a walk in clinic. These are popping up all over the country in pharmacies, drugstores, and shopping centers. They're usually staffed by nurse practitioners or physician assistants that have been trained to treat common illnesses and complaints. They're usually fairly quick and inexpensive. However, if you have serious medical issues or chronic medical problems, these are probably not your best option. ° °No Primary Care Doctor: °- Call [facility] at  832-8000 - they can help you locate a primary care doctor that  accepts your insurance, provides certain services, etc. °- Physician Referral Service- 1-800-533-3463 ° °Chronic Pain Problems: °Organization         Address  Phone   Notes  °[facility]  (336) 297-2271 Patients need to be referred by their primary care doctor.  ° °Medication Assistance: °Organization         Address  Phone   Notes  °[place] [facility] [address] °[place], [place] [address] (336) 641-8030 --Must be a resident of [place] °-- Must have NO insurance coverage whatsoever (no Medicaid/ Medicare, etc.) °-- The pt. MUST have a primary care doctor that directs their care regularly and follows them in the community °  °[facility]  (866) 331-1348   °[employer]  (888) 892-1162   ° °Agencies that provide inexpensive medical care: °Organization         Address  Phone   Notes  °[facility]   (336) 832-8035   °[facility]    (336) 832-7272   °[facility] [address] °[place], [place] [address] (336) 832-4777   °[facility] [address], °[place] (336) 271-4999   °[facility]    (336) 373-0678   °[facility]    (336) 272-1050   °[facility] ° [address], [place] Phone:  (336) 832-4444, Fax:  (336) 832-4440 Hours of Operation:  9 am - 6 pm, M-F.  Also accepts Medicaid/Medicare and self-pay.  °[facility] ° [address], [place] Phone: (336) [phone], Fax: (336) [phone]. Hours of Operation:  8:30 am - 5:30 pm, M-F.  Also accepts Medicaid and self-pay.  °[facility] [place] [address], [place] Phone: (336) 878-6027   °[facility] [address],   [place], [place] (336)723-1848, Ext. 123 Mondays & Thursdays: 7-9 AM.  First 15 patients are seen on a first come, first serve basis. °  ° °Medicaid-accepting [place] Providers: ° °Organization         Address  Phone   Notes  °[facility] [address], [place] (336) 641-2100 Also accepts self-pay patients.  °[facility] [address], [place] ° (336) 856-9996   °[facility] [address], [place] (336) 288-8857   °[facility] [address] [place] [address], [place] (336) 299-7000   °Veita Bland [address], [place]  ° (336) 373-1557 Only accepts [place] Access Medicaid patients after they have their name applied to their card.  ° °Self-Pay (no insurance) in [place]: ° °Organization         Address  Phone   Notes  °Sickle Cell Patients, [facility] [address], [place] (336) 832-1970   °[facility] [address], [place] (336) 832-4400   °[facility] ° [address], [place] (336) 992-4800   °[facility]/Dr. Osei-Bonsu ° [address] [place] [address], [place] or [address], [place] (336) 841-8500 Phone number for both [place] and [place] locations is the same.  °[facility] [address], [place] (336) 299-0000   °[facility] [address] [place] [address], [place] or [address] (336) 852-7530 °(336) 878-2260   °[facility] [address], [place] (336) 350-1642, phone; (336) 294-5005, fax Sees patients 1st and 3rd Saturday of every month.  Must not qualify for public or private insurance (i.e. Medicaid, Medicare, [place] Health Choice, Veterans' Benefits) • Household income should be no more than 200% of the poverty level •The clinic cannot treat you if you are pregnant or think you are pregnant • Sexually transmitted diseases are not treated at the clinic.  ° ° °Dental Care: °Organization         Address  Phone  Notes  °[facility] [address], [place] (336) 641-6152 Accepts children up to age 21 who are enrolled in Medicaid or [place] Health Choice; pregnant women with a Medicaid card; and children who have applied for Medicaid or [place] Health Choice, but were declined, whose parents can pay a reduced fee at time of service.  °[facility] [place]  [address], [place] (336) 641-7733 Accepts children up to age 21 who are enrolled in Medicaid or [place] Health Choice; pregnant women with a Medicaid card; and children who have applied for Medicaid or [place] Health Choice, but were declined, whose parents can pay a reduced fee at time of service.  °[facility] ° [address], [place] (336) 641-4533 Patients are seen by appointment only. Walk-ins are not accepted. [facility] will see patients 18 years of age and older. °Monday - Tuesday  (8am-5pm) °Most Wednesdays (8:30-5pm) °$30 per visit, cash only  °[facility] ° [address], [place] (336) 641-4533 Patients are seen by appointment only. Walk-ins are not accepted. [facility] will see patients 18 years of age and older. °One Wednesday Evening (Monthly: Volunteer Based).  $30 per visit, cash only  °[facility]inics  (919) 537-3737 for adults; Children under age 4, call Graduate Pediatric Dentistry at (919) [phone]. Children aged 4-14, please call (919) [phone] to request a pediatric application. ° Dental services are provided in all areas of dental care including fillings, crowns and bridges, complete and partial dentures, implants, gum treatment, root canals, and extractions. Preventive care is also provided. Treatment is provided to both adults and children. °Patients are selected via a lottery and there is often a waiting list. °  °[facility] [address], °[place] ° (336) 763-8833 [email or web address] °  °[facility] [address], [place], [place] (336)723-1848, Ext. 123 Second and Fourth Thursday of each month, opens at 6:30 AM; Clinic ends at 9 AM.  Patients are seen on a first-come first-served basis, and a limited number are seen during each clinic.  ° °[facility] ° [address], [place], [place] (336) 723-7904   Eligibility Requirements °You must have lived in [place], [place], or [place] counties for at least the last three months. °  You cannot be eligible for state or federal sponsored healthcare insurance, including Veterans Administration, Medicaid, or Medicare. °  You generally cannot be eligible for healthcare insurance through your employer.  °  How to apply: °Eligibility screenings are held every Tuesday and Wednesday afternoon from 1:00 pm until 4:00 pm. You do not need an appointment for the interview!  °[facility] [address], [place], [place]  336-631-2330   °[facility]  336-342-8273   °[facility]  336-703-3100   °[facility]  336-570-6415   ° °Behavioral Health Resources in the Community: °Intensive Outpatient Programs °Organization         Address  Phone  Notes  °[facility] [address], [place], [place] 336-878-6098   °[facility] [address], [place], [place] 336-832-9800   °[facility] [address], [place], [place] ° 336-882-2125   °[facility] [address],  °[place], [place] 1-800-853-5163 or 336-641-4981   °Substance Abuse Resources °Organization         Address  Phone  Notes  °[facility]  336-882-2125   °[facility]  336-784-9470   °[facility]  336-285-9073   °[facility]  336-845-3988   °Residential & Outpatient Substance Abuse Program  1-800-659-3381   °Psychological Services °Organization         Address  Phone  Notes  °[facility]  336- 832-9600   °[facility]  336- 378-7881   °[facility] [address], [place] 1-800-853-5163 or 336-641-4981   ° °Mobile Crisis Teams °Organization         Address  Phone  Notes  °[facility], Mobile Crisis Care Unit  1-877-626-1772   °Assertive °[facility] ° [address] [place],  336-834-9664   °Sharon DeEsch [address] °[place]  336-554-5454   ° °Self-Help/Support Groups °Organization         Address  Phone             Notes  °[facility] [place] - variety of support groups  336- 373-1402 Call for more information  °Narcotics Anonymous (NA), [facility] [address], °[place]   2 meetings at this location  ° °Residential Treatment Programs °Organization         Address  Phone    Notes  °[facility] [address],    °[place]  1-866-801-8205   °[facility] ° [address], [place], [place] 704-293-8524   °[facility] [address], [place] 336-845-3988 Admissions: 8am-3pm M-F  °[facility] [address]    °[place], [place] 336-841-1104   °[facility] [address], [place], [place] 336-379-7146   °[facility] [address]  °[place], [place] 336-285-9073   °[facility] - Intensive Outpatient [address], [place], [place] 336-852-3033   °[facility] [address]  °[place], [place] 1-877-615-2722 or 336-784-9470   °[facility] [address] [place], [place] 336-227-7417 Accepts Medicaid  °[facility] [address]  °[place] 1-800-659-3381 Substance Abuse/Addiction Treatment  ° °[place] Behavioral Health Resources °Organization         Address  Phone  Notes  °[facility]  (888) 581-9988   °Julie Brannon, PhD [address] [place], [place]   (336) 349-5553 or (336) 951-0000   °[facility]   [address] °[place], [place] (336) 349-4454   °[facility] [address], [place], [place] (336) 342-8316 Insurance/Medicaid/sponsorship through [facility]  °[facility] [address]                                    [place], [place] (336) 342-8316 Therapy/tele-psych/case  °[facility] [address]  ° [place],  (336) 349-2233    °Dr. Arfeen  (336) 349-4544   °[facility] [place]  [employer] [place] [facility] 1) [address],  °2) [address], [place] °3)  [address], [place] (336) 349-3220 °(336) 342-7768 ° °(336) 342-8140   °[place] Child Abuse Hotline (336) 342-1394 or (336) 342-3537 (After Hours)    ° ° ° °

## 2015-02-19 IMAGING — CR DG TIBIA/FIBULA 2V*L*
4 series · 4 of 4 positions shown · non-contrast
Comparison: Left tibia and fibula films 06/24/2013.

CLINICAL DATA: Leg pain. Status post ORIF. Stepped off a forklift 3
days ago. Erythema and swelling.

EXAM:
LEFT TIBIA AND FIBULA - 2 VIEW

[t tib/fib ap left (1 of 2)]
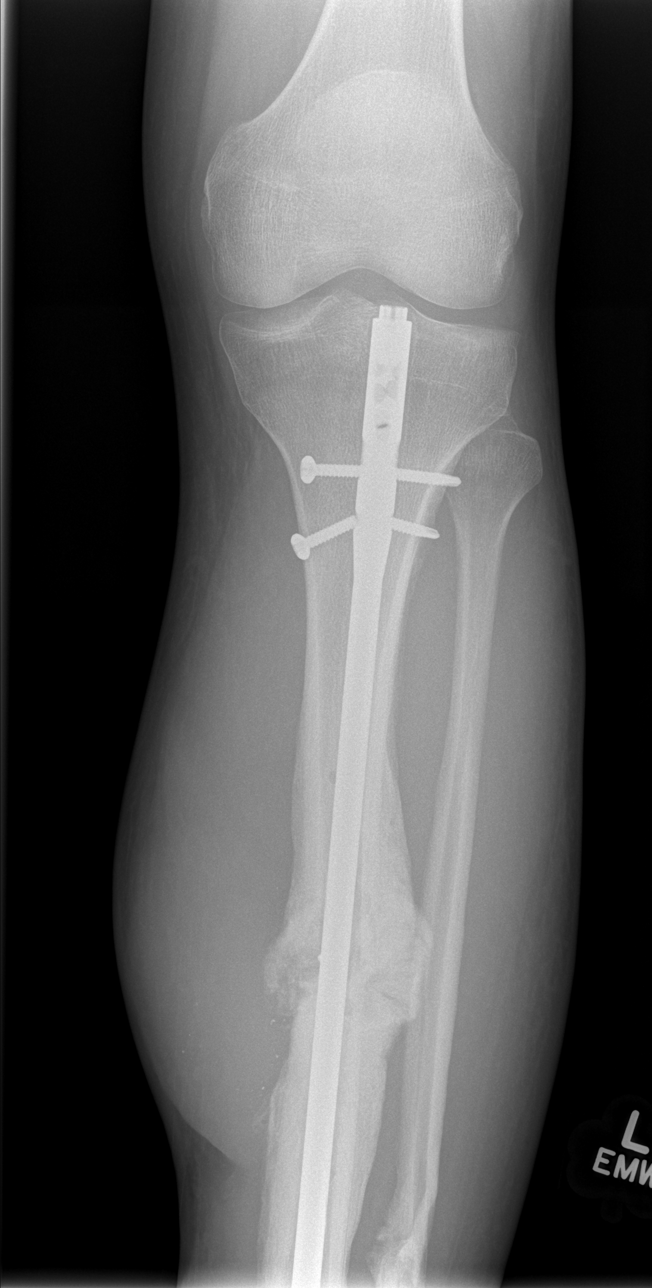

[t tib/fib ap left (2 of 2)]
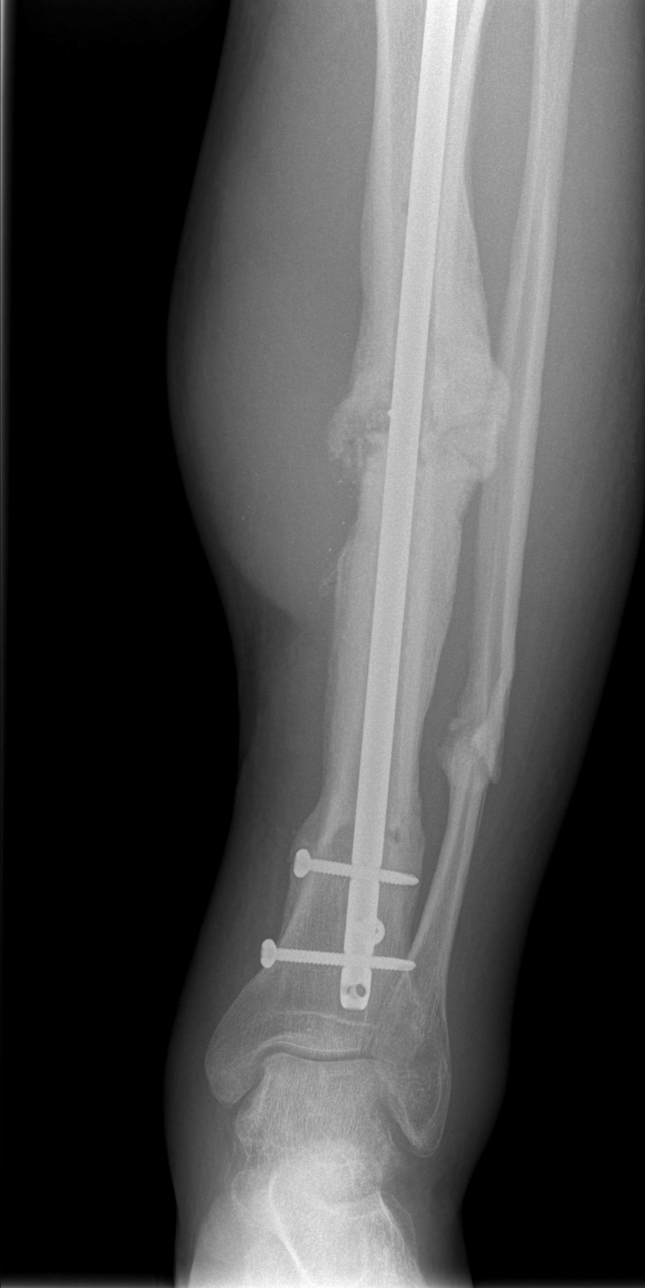

[t tib/fib lat left (1 of 2)]
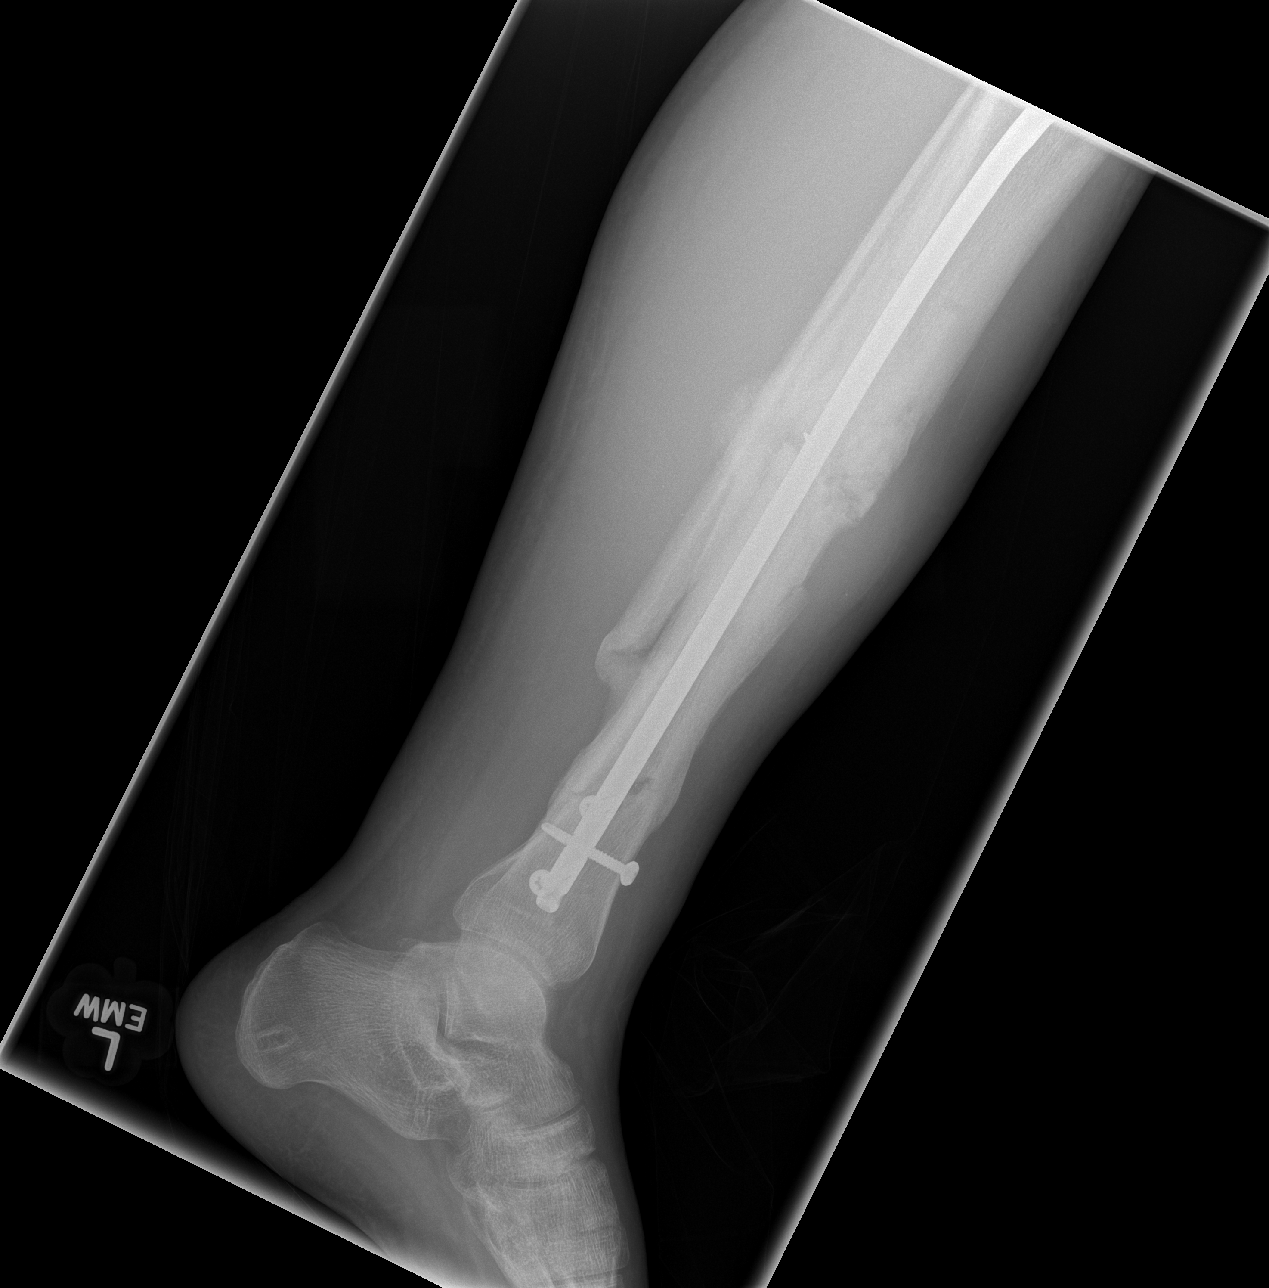

[t tib/fib lat left (2 of 2)]
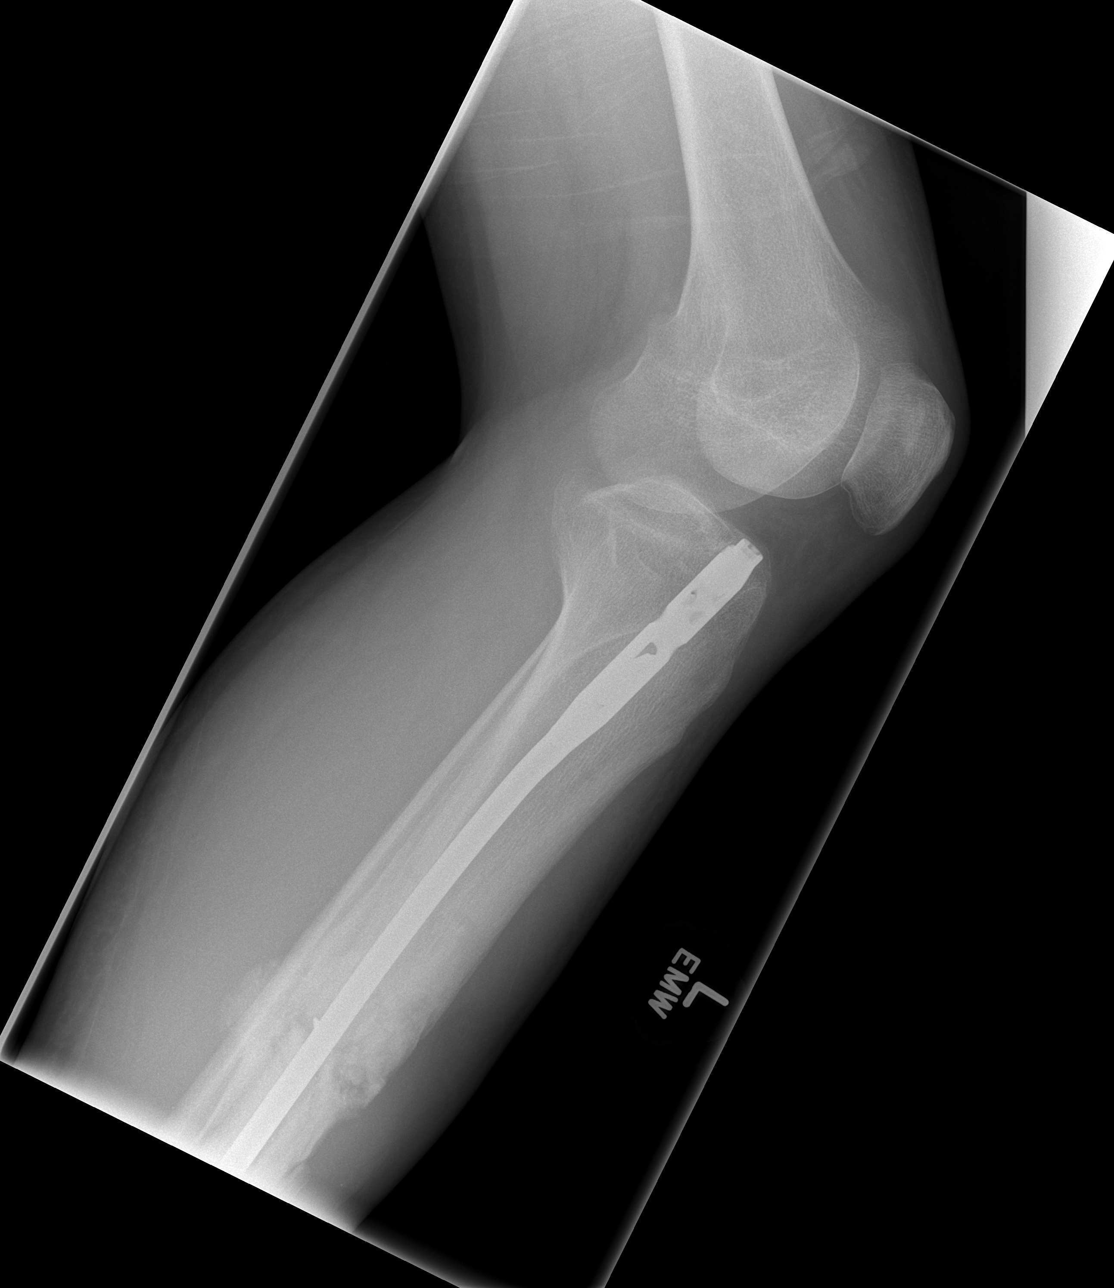

[4 of 4 positions shown; findings below may reference images not displayed]

FINDINGS: The patient is status post placement of an IM rod across the tibia
fracture. There is continued interval healing across the segmental
fractures. Three distal screws are in place. Fracture of 1 of the
more proximal screws is stable.

There is healing about the distal fibula fracture. The knee and
ankle joints are located.

The soft tissues are not significantly changed.
IMPRESSION: 1. Interval healing of comminuted tibia and fibular fractures status
post ORIF.
2. Stable fracture involving 1 of the proximal screws.

## 2024-05-31 ENCOUNTER — Emergency Department (HOSPITAL_COMMUNITY)
Admission: EM | Admit: 2024-05-31 | Discharge: 2024-05-31 | Disposition: A | Discharge status: Home / Self Care | Attending: Emergency Medicine | Admitting: Emergency Medicine

## 2024-05-31 ENCOUNTER — Other Ambulatory Visit: Payer: Self-pay

## 2024-05-31 ENCOUNTER — Emergency Department (HOSPITAL_COMMUNITY)

## 2024-05-31 ENCOUNTER — Encounter (HOSPITAL_COMMUNITY): Payer: Self-pay | Admitting: *Deleted

## 2024-05-31 DIAGNOSIS — W448XXA Other foreign body entering into or through a natural orifice, initial encounter: Secondary | ICD-10-CM | POA: Diagnosis not present

## 2024-05-31 DIAGNOSIS — T189XXA Foreign body of alimentary tract, part unspecified, initial encounter: Secondary | ICD-10-CM | POA: Diagnosis present

## 2024-05-31 DIAGNOSIS — Z711 Person with feared health complaint in whom no diagnosis is made: Secondary | ICD-10-CM | POA: Insufficient documentation

## 2024-05-31 NOTE — ED Triage Notes (Signed)
 Pt states he swallowed a screw x one hour ago; pt states the screw was approximately one inch long; pt denies any abdominal pain at this time

## 2024-05-31 NOTE — Discharge Instructions (Signed)
 Follow-up closely with her primary care doctor on an outpatient basis.  Return to emergency department immediately for any new or worsening symptoms.  [facility] Primary Care Doctor List    Syliva Overman, MD. Specialty: [facility] Medicine Contact information: [address]  [place] [address]  [phone]   Lilyan Punt, MD. Specialty: [facility] Medicine Contact information: [address]  [place] [address]  [phone]   Avon Gully, MD Specialty: Internal Medicine Contact information: [address] [place] [address]  [phone]   Catalina Pizza, MD. Specialty: Internal Medicine Contact information: [address]  [place] [address]  [phone]    [facility] (Dr. Selena Batten) Specialty: Family Medicine Contact information: [address]  [place] [address]  [phone]   John Giovanni, MD. Specialty: [facility] Medicine Contact information: [address]  [address]  [place] [address]  [phone]   Carylon Perches, MD. Specialty: Internal Medicine Contact information: [address]  [address]  [place] [address]  [phone]   [facility] Medicine: [address] [phone]  [facility], Family medicine [address]  [phone]  [facility] [address] [place], [place] [phone]  [facility]: [address] [phone]    [facility]  [address] [place], [place] [address] [phone]  Services [facility] offers a variety of basic health services.  Services include but are not limited to: Blood pressure checks  Heart rate checks  Blood sugar checks  Urine analysis  Rapid strep tests  Pregnancy tests.  Health education and referrals  People needing more complex services will be directed to a physician online. Using these virtual visits, doctors can evaluate and  prescribe medicine and treatments. There will be no medication on-site, though [facility] will help patients fill their prescriptions at little to no cost.   For More information please go to: [email or web address]  Allergy and Asthma:    [address] [facility] [address] [facility] [address] [facility] [phone]  Urology:  [address]   [phone]  [facility]  [address] [place], [place] [phone]  Orthopedics   [address] [place], [place] [phone]  Endocrinology  [address] [place], [place] [phone]  Podiatry: [facility] [phone]

## 2024-05-31 NOTE — ED Provider Notes (Signed)
 [place] EMERGENCY DEPARTMENT AT [facility] Provider Note   CSN: [id number] Arrival date & time: 05/31/24  1358     Patient presents with: Swallowed Foreign Body   Billy Lester is a 43 y.o. male.   Patient is a 43 year old male who presents emergency department stating that he swallowed a screw approximately an hour and a half prior to arrival.  He notes that it measured approximately an inch and a half.  Patient has no active complaints at this point.  He denies any associated chest pain, shortness of breath, abdominal pain, nausea, vomiting, diarrhea.   Swallowed Foreign Body Pertinent negatives include no chest pain, no abdominal pain and no shortness of breath.       Prior to Admission medications   Medication Sig Start Date End Date Taking? Authorizing Provider  amoxicillin (AMOXIL) 500 MG tablet Take 500 mg by mouth 3 (three) times daily.    [provider]  clindamycin  (CLEOCIN ) 300 MG capsule Take 1 capsule (300 mg total) by mouth 4 (four) times daily. X 7 days 08/25/13   Lenor Hollering, MD  etodolac  (LODINE ) 500 MG tablet Take 1 tablet (500 mg total) by mouth 2 (two) times daily. 09/01/13   Randol Simmonds, MD  fish oil-omega-3 fatty acids 1000 MG capsule Take 2 g by mouth daily.    [provider]  HYDROcodone -acetaminophen  (NORCO) 10-325 MG per tablet Take 1 tablet by mouth every 6 (six) hours as needed for pain.    [provider]  HYDROcodone -acetaminophen  (NORCO) 5-325 MG per tablet Take 1 tablet by mouth every 6 (six) hours as needed for pain. 03/14/13   Palumbo, April, MD  Multiple Vitamin (MULTIVITAMIN) tablet Take 1 tablet by mouth daily.    [provider]  oxyCODONE -acetaminophen  (PERCOCET) 5-325 MG per tablet Take 2 tablets by mouth every 4 (four) hours as needed. 08/25/13   Lenor Hollering, MD  oxyCODONE -acetaminophen  (PERCOCET) 5-325 MG per tablet Take 1 tablet by mouth every 6 (six) hours as needed for severe pain.  09/20/13   Lorenzo Bouchard, MD  oxyCODONE -acetaminophen  (PERCOCET/ROXICET) 5-325 MG per tablet Take 1-2 tablets by mouth every 6 (six) hours as needed for pain. 09/20/12   Levora Riggs, PA-C  oxyCODONE -acetaminophen  (PERCOCET/ROXICET) 5-325 MG per tablet Take 1 tablet by mouth every 4 (four) hours as needed for pain. 10/16/12   Pickering, Vrinda, NP  oxyCODONE -acetaminophen  (PERCOCET/ROXICET) 5-325 MG per tablet Take 2 tablets by mouth every 6 (six) hours as needed for pain. 03/23/13   Doretha Folks, MD    Allergies: Ceclor [cefaclor] and Keflex [cephalexin]    Review of Systems  Respiratory:  Negative for shortness of breath.   Cardiovascular:  Negative for chest pain.  Gastrointestinal:  Negative for abdominal pain, nausea and vomiting.  All other systems reviewed and are negative.   Updated Vital Signs BP (!) 131/101 (BP Location: Left Arm)   Pulse 77   Temp 97.8 F (36.6 C) (Oral)   Resp 16   Ht 5' 10 (1.778 m)   Wt 95.3 kg   SpO2 98%   BMI 30.13 kg/m   Physical Exam Vitals and nursing note reviewed.  Constitutional:      Appearance: Normal appearance.  HENT:     Head: Normocephalic and atraumatic.     Nose: Nose normal.     Mouth/Throat:     Mouth: Mucous membranes are moist.  Eyes:     Extraocular Movements: Extraocular movements intact.     Conjunctiva/sclera: Conjunctivae normal.  Pupils: Pupils are equal, round, and reactive to light.  Cardiovascular:     Rate and Rhythm: Normal rate and regular rhythm.     Pulses: Normal pulses.     Heart sounds: Normal heart sounds. No murmur heard.    No gallop.  Pulmonary:     Effort: Pulmonary effort is normal. No respiratory distress.     Breath sounds: Normal breath sounds. No wheezing or rales.  Abdominal:     General: Abdomen is flat. Bowel sounds are normal. There is no distension.     Palpations: Abdomen is soft.     Tenderness: There is no abdominal tenderness. There is no guarding.  Musculoskeletal:         General: Normal range of motion.     Cervical back: Normal range of motion and neck supple.  Skin:    General: Skin is warm and dry.  Neurological:     General: No focal deficit present.     Mental Status: He is alert and oriented to person, place, and time. Mental status is at baseline.  Psychiatric:        Mood and Affect: Mood normal.        Behavior: Behavior normal.        Thought Content: Thought content normal.        Judgment: Judgment normal.     (all labs ordered are listed, but only abnormal results are displayed) Labs Reviewed - No data to display  EKG: None  Radiology: DG Abdomen Acute W/Chest Result Date: 05/31/2024 CLINICAL DATA:  swallowed foreign body, reportedly swallowed a 1 inch screw EXAM: DG ABDOMEN ACUTE WITH 1 VIEW CHEST COMPARISON:  None Available. FINDINGS: Nonobstructive bowel gas pattern.Moderate volume fecal loading throughout the colon.No pneumoperitoneum. No organomegaly or radiopaque calculi. No focal airspace consolidation, pleural effusion, or pneumothorax. No cardiomegaly.No acute fracture or destructive lesion. Multilevel thoracolumbar osteophytosis. IMPRESSION: 1. Nonobstructive bowel gas pattern. 2. Clear lungs. Electronically Signed   By: Rogelia Myers M.D.   On: 05/31/2024 14:55     Procedures   Medications Ordered in the ED - No data to display                                  Medical Decision Making Patient is doing well at this time and is stable for discharge home.  X-rays were otherwise unremarkable.  Turns out the patient was holding the screw in his mouth and he did spit it out after going to x-ray.  Do not suspect any further workup is warranted at this time.  Patient does admit that he did this purely seeking attention.  He has no other acute complaints at this time.  Strict turn precautions were discussed for any new or worsening symptoms.  Patient voiced understanding and had no additional questions.  Amount and/or Complexity  of Data Reviewed Radiology: ordered.        Final diagnoses:  Feared condition not demonstrated    ED Discharge Orders     None          Scotti Kosta D, PA-C 05/31/24 1507    Charlyn Sora, MD 06/02/24 [phone]
